# Patient Record
Sex: Male | Born: 1995 | Race: Black or African American | Hispanic: No | Marital: Single
Health system: Southern US, Community
[De-identification: ages and names within clinical notes are randomized; demographics above are authoritative.]

---
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
## 2019-12-13 ENCOUNTER — Encounter (HOSPITAL_COMMUNITY): Payer: Self-pay | Admitting: Emergency Medicine

## 2019-12-13 ENCOUNTER — Emergency Department (HOSPITAL_COMMUNITY): Payer: Self-pay

## 2019-12-13 ENCOUNTER — Emergency Department (HOSPITAL_COMMUNITY)
Admission: EM | Admit: 2019-12-13 | Discharge: 2019-12-13 | Disposition: A | Discharge status: Home / Self Care | Payer: Self-pay | Attending: Emergency Medicine | Admitting: Emergency Medicine

## 2019-12-13 DIAGNOSIS — G4452 New daily persistent headache (NDPH): Secondary | ICD-10-CM | POA: Insufficient documentation

## 2019-12-13 NOTE — ED Notes (Signed)
Patient verbalizes understanding of discharge instructions. Opportunity for questioning and answers were provided. Armband removed by staff. Patient discharged from ED.  

## 2019-12-13 NOTE — Discharge Instructions (Addendum)
Today your CT scan on your head was reassuring without mass or other cause for your symptoms found.  Please stop taking any ibuprofen or tylenol or other medications for your headache as this can worsen headaches.  Please start recording your headache symptoms.    If you develop any vision changes, fevers, weakness, numbness, your headache changes or you have nausea, vomiting, or other concerns please seek additional medical care and evaluation.

## 2019-12-13 NOTE — ED Triage Notes (Signed)
Pt reports L sided frontal headache for the past few weeks without associated side effects. Taking tylenol without relief. A/ox4, speech clear, no neuro deficits.

## 2019-12-13 NOTE — ED Provider Notes (Signed)
[facility] EMERGENCY DEPARTMENT Provider Note   CSN: [id number] Arrival date & time: 12/13/19  9476     History Chief Complaint  Patient presents with  . Headache    Randy Beard is a 24 y.o. male with no pertinent past medical history who presents today for evaluation of headaches.  He reports that over the past 2 to 3 weeks he has had a headache every single day.  These are on his temples, most often is on the left side however can occasionally be on the right side.  He has been taking Tylenol every day and reports that it will take the edge off his headache.  He states that his headache is keeping him from sleeping at night.  He denies any fevers.  No chiropractic adjustments or neck pain.  He denies any vision changes, weakness, numbness or tingling.  He does not get headaches frequently.  No significant changes in caffeine intake, diet, or life style.  He does note that he ran out of his Adderall prescription a few months ago and since then has had increased difficulty concentrating at work.  He does not have a primary care doctor.    HPI     History reviewed. No pertinent past medical history.  There are no problems to display for this patient.   Past Surgical History:  Procedure Laterality Date  . LAPAROSCOPIC APPENDECTOMY  01/01/2012   Procedure: APPENDECTOMY LAPAROSCOPIC;  Surgeon: Randy Petit. Leonia Corona, MD;  Location: [facility] OR;  Service: Pediatrics;  Laterality: N/A;       No family history on file.  Social History   Tobacco Use  . Smoking status: Not on file  Substance Use Topics  . Alcohol use: No  . Drug use: No    Home Medications Prior to Admission medications   Not on File    Allergies    Cephalosporins  Review of Systems   Review of Systems  Constitutional: Negative for chills and fever.  HENT: Negative for congestion.   Eyes: Negative for pain and visual disturbance.  Respiratory: Negative for chest tightness and shortness of  breath.   Cardiovascular: Negative for chest pain.  Gastrointestinal: Negative for abdominal pain, diarrhea, nausea and vomiting.  Genitourinary: Negative for dysuria.  Musculoskeletal: Negative for back pain and neck pain.  Skin: Negative for color change, rash and wound.  Neurological: Positive for headaches. Negative for dizziness, seizures, syncope, speech difficulty, weakness, light-headedness and numbness.  All other systems reviewed and are negative.   Physical Exam Updated Vital Signs BP 113/71   Pulse 58   Temp 98 F (36.7 C) (Oral)   Resp 16   Ht 5\' 7"  (1.702 m)   Wt 77.1 kg   SpO2 99%   BMI 26.63 kg/m   Physical Exam Vitals and nursing note reviewed.  Constitutional:      General: He is not in acute distress.    Appearance: He is well-developed. He is not diaphoretic.  HENT:     Head: Normocephalic and atraumatic.     Right Ear: Tympanic membrane, ear canal and external ear normal.     Left Ear: Tympanic membrane, ear canal and external ear normal.     Mouth/Throat:     Mouth: Mucous membranes are moist.  Eyes:     General: No scleral icterus.       Right eye: No discharge.        Left eye: No discharge.     Extraocular Movements:  Extraocular movements intact.     Right eye: No nystagmus.     Left eye: No nystagmus.     Conjunctiva/sclera: Conjunctivae normal.     Pupils: Pupils are equal, round, and reactive to light.  Cardiovascular:     Rate and Rhythm: Normal rate and regular rhythm.     Heart sounds: Normal heart sounds. No murmur heard.   Pulmonary:     Effort: Pulmonary effort is normal. No respiratory distress.     Breath sounds: No stridor.  Abdominal:     General: There is no distension.  Musculoskeletal:        General: No deformity.     Cervical back: Normal range of motion and neck supple.  Skin:    General: Skin is warm and dry.  Neurological:     Mental Status: He is alert and oriented to person, place, and time.     Motor: No  abnormal muscle tone.     Comments: Mental Status:  Alert, oriented, thought content appropriate, able to give a coherent history. Speech fluent without evidence of aphasia. Able to follow 2 step commands without difficulty.  Cranial Nerves:  II:  Peripheral visual fields grossly normal, pupils equal, round, reactive to light III,IV, VI: ptosis not present, extra-ocular motions intact bilaterally  V,VII: smile symmetric, facial light touch sensation equal VIII: hearing grossly normal to voice  X: uvula elevates symmetrically  XI: bilateral shoulder shrug symmetric and strong XII: midline tongue extension without fassiculations Motor:  Normal tone. 5/5 in upper and lower extremities bilaterally including strong and equal grip strength and dorsiflexion/plantar flexion Cerebellar: normal finger-to-nose with bilateral upper extremities Gait: normal gait and balance CV: distal pulses palpable throughout   Psychiatric:        Behavior: Behavior normal.     ED Results / Procedures / Treatments   Labs (all labs ordered are listed, but only abnormal results are displayed) Labs Reviewed - No data to display  EKG None  Radiology CT Head Wo Contrast  Result Date: 12/13/2019 CLINICAL DATA:  Headache. EXAM: CT HEAD WITHOUT CONTRAST TECHNIQUE: Contiguous axial images were obtained from the base of the skull through the vertex without intravenous contrast. COMPARISON:  None. FINDINGS: Brain: No evidence of acute infarction, hemorrhage, hydrocephalus, extra-axial collection or mass lesion/mass effect. Vascular: No hyperdense vessel or unexpected calcification. Skull: Normal. Negative for fracture or focal lesion. Sinuses/Orbits: No acute finding. Other: None. IMPRESSION: No acute intracranial pathology. Electronically Signed   By: Randy Beard M.D.   On: 12/13/2019 17:18    Procedures Procedures (including critical care time)  Medications Ordered in ED Medications - No data to  display  ED Course  I have reviewed the triage vital signs and the nursing notes.  Pertinent labs & imaging results that were available during my care of the patient were reviewed by me and considered in my medical decision making (see chart for details).    MDM Rules/Calculators/A&P                         Patient is a 24 year old man who presents today for evaluation of a new persistent daily headache over the past few weeks.  He is neurovascularly intact on my exam.  He does not frequently get headaches, and as this is a new, persistent and daily event CT head was ordered.  No stroke, mass, intracranial hemorrhage or other cause for his headache is found.  We discussed analgesic rebound headaches  in addition to increasing p.o. hydration.  He is instructed on keeping a headache diary.  Basic life style modifications such as reducing stress was discussed.  PCP follow up.  He is afebrile, not tachycardic or tachypneic, do not suspect infectious cause of his symptoms based on duration, reassuring vitals and neuro intact.  Return precautions were discussed with patient who states their understanding.  At the time of discharge patient denied any unaddressed complaints or concerns.  Patient is agreeable for discharge home.  Note: Portions of this report may have been transcribed using voice recognition software. Every effort was made to ensure accuracy; however, inadvertent computerized transcription errors may be present   Final Clinical Impression(s) / ED Diagnoses Final diagnoses:  New daily persistent headache    Rx / DC Orders ED Discharge Orders    None       Norman Clay 12/13/19 2359    Linwood Dibbles, MD 12/14/19 [phone]

## 2020-05-28 ENCOUNTER — Other Ambulatory Visit: Payer: 59

## 2020-05-28 DIAGNOSIS — Z20822 Contact with and (suspected) exposure to covid-19: Secondary | ICD-10-CM

## 2020-05-29 LAB — NOVEL CORONAVIRUS, NAA: SARS-CoV-2, NAA: NOT DETECTED

## 2020-05-29 LAB — SARS-COV-2, NAA 2 DAY TAT

## 2021-11-14 IMAGING — CT CT HEAD W/O CM
4 series · 17 of 47 positions shown, 19 images · non-contrast
Comparison: None.

CLINICAL DATA: Headache.

EXAM:
CT HEAD WITHOUT CONTRAST
TECHNIQUE: Contiguous axial images were obtained from the base of the skull
through the vertex without intravenous contrast.

[Series 3: head without · axial · non-contrast · 0.45mm/px · z∈[+184,+304]mm · 7 of 33 slices shown, 9 images]
[im 5/33  brain]
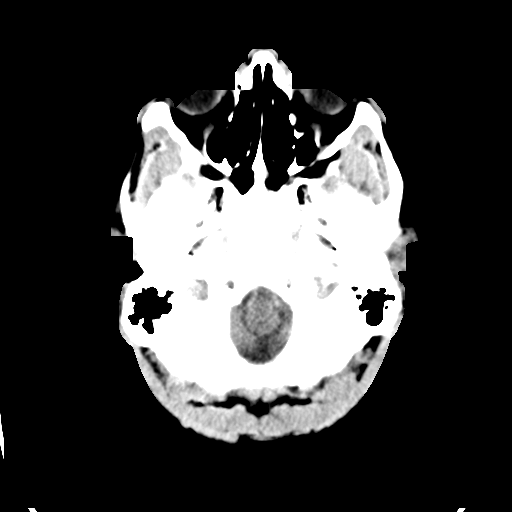
[im 5/33  bone]
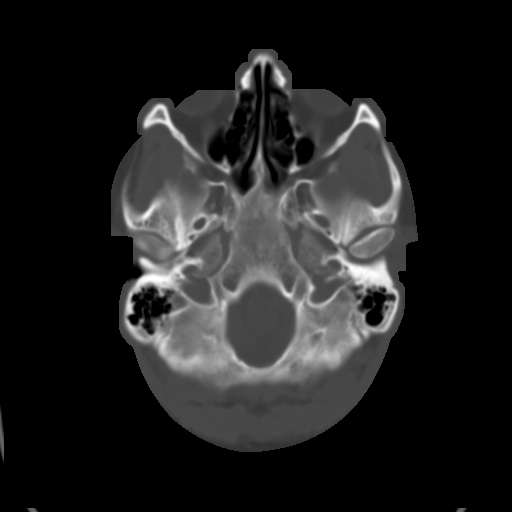
[im 9/33  brain]
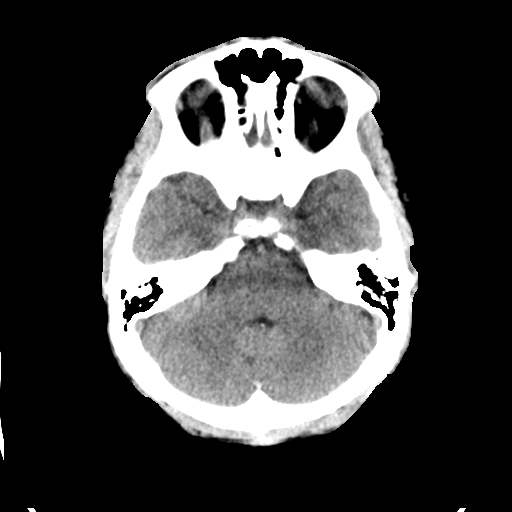
[im 13/33  brain]
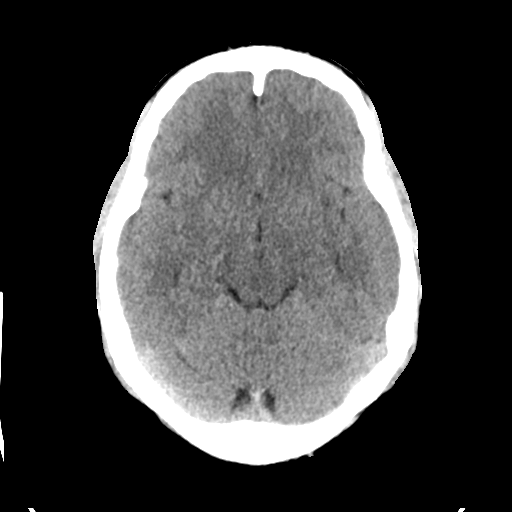
[im 17/33  brain]
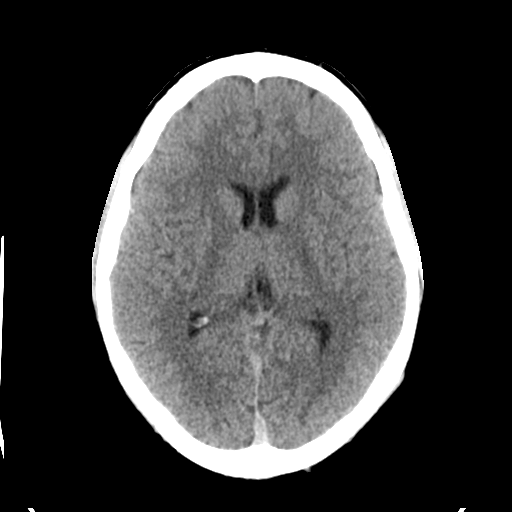
[im 21/33  brain]
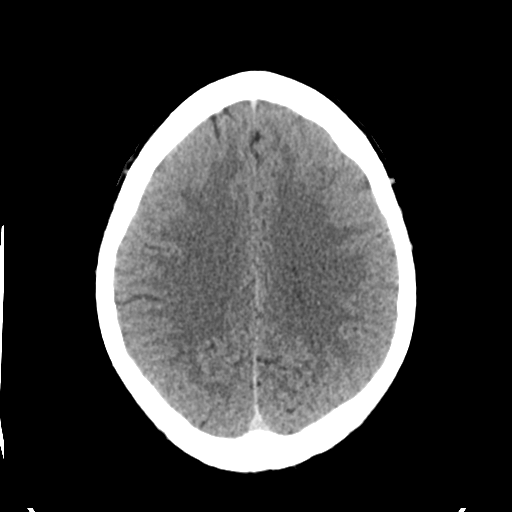
[im 21/33  bone]
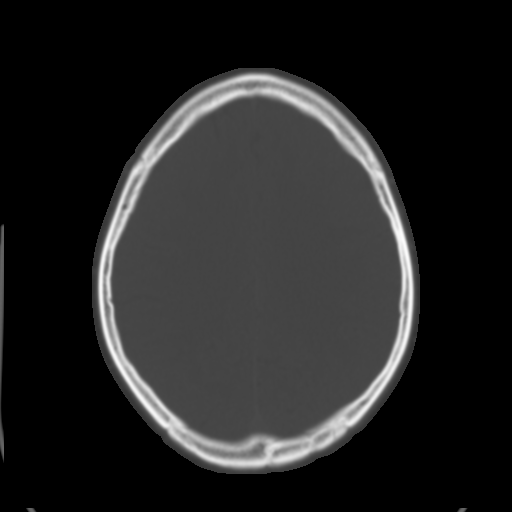
[im 25/33  brain]
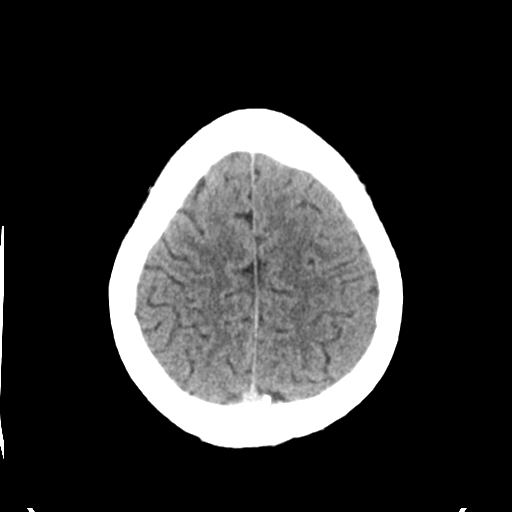
[im 29/33  brain]
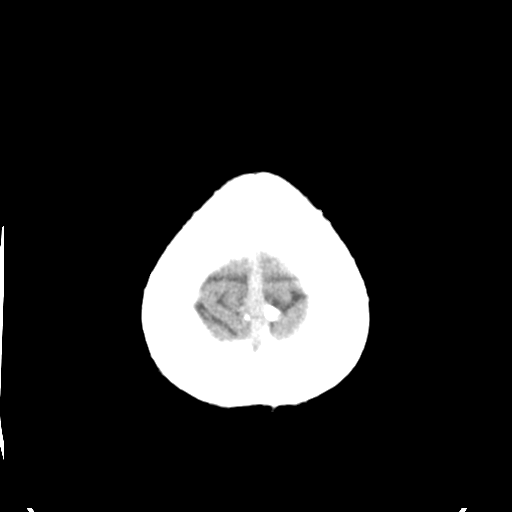

[Series 4: head bone · axial · 0.45mm/px · z∈[+180,+236]mm · 4 of 82 slices shown]
[im 9/82  bone]
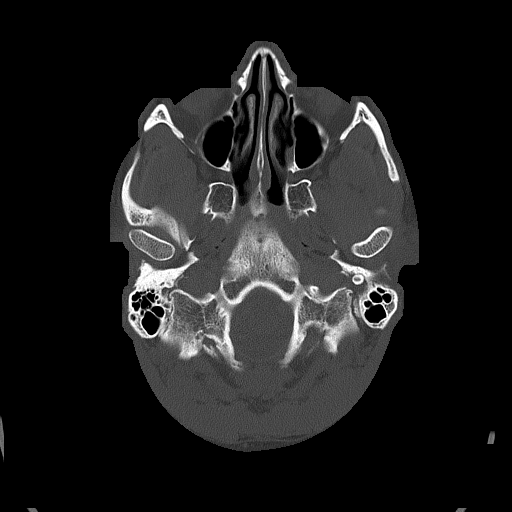
[im 17/82  bone]
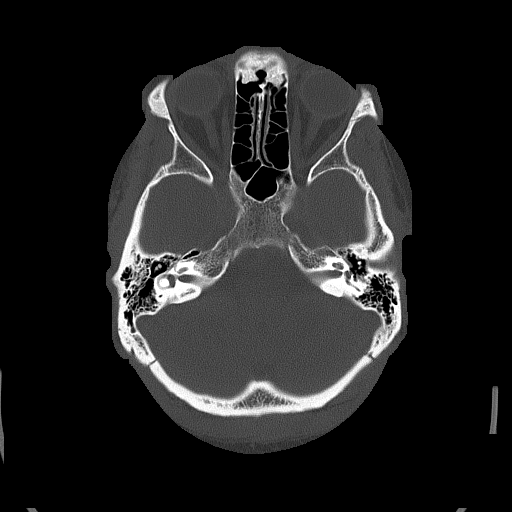
[im 25/82  bone]
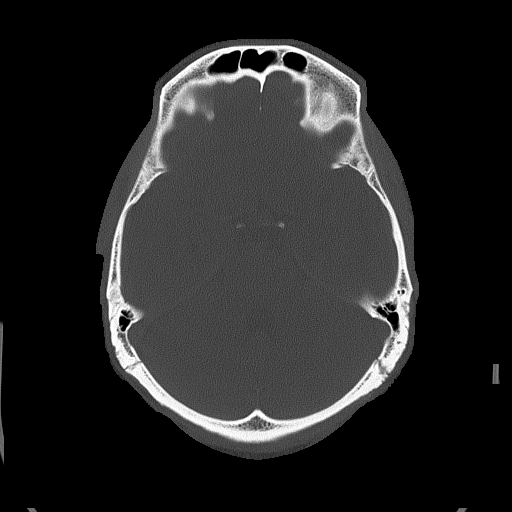
[im 37/82  bone]
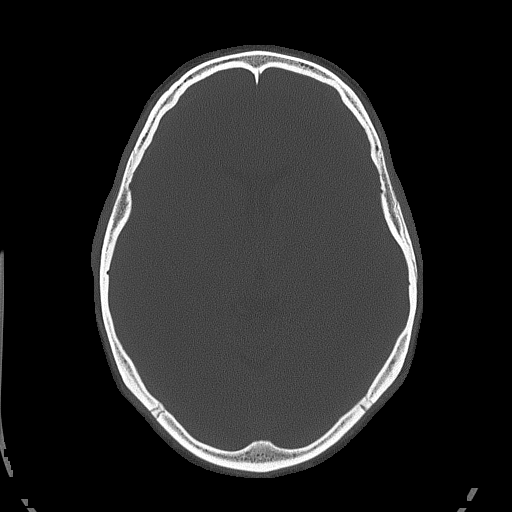

[Series 5: head without cor · coronal · non-contrast · 0.36mm/px · 3 of 73 slices shown]
[im 25/73  brain]
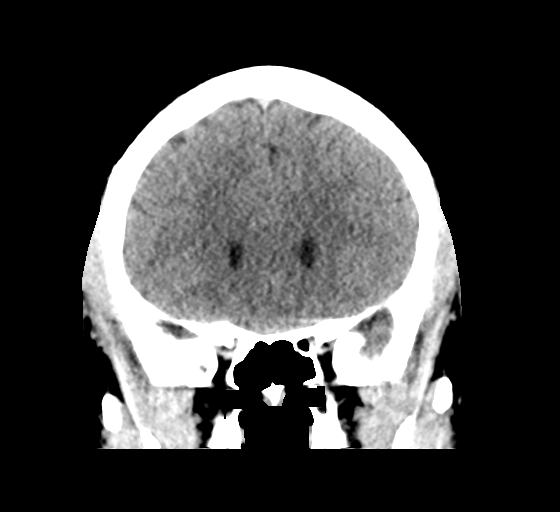
[im 33/73  brain]
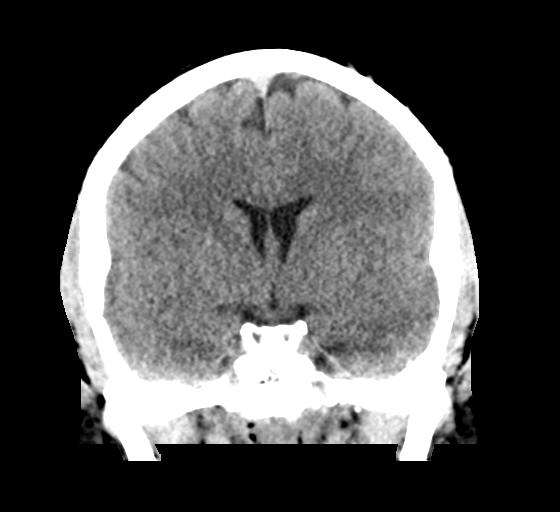
[im 41/73  brain]
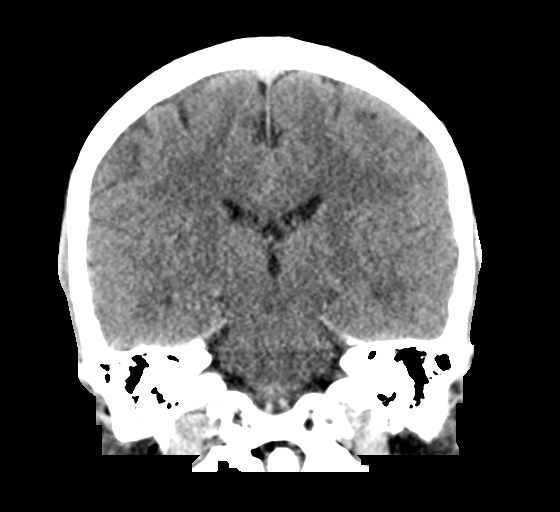

[Series 6: head without sag · sagittal · non-contrast · 0.35mm/px · 3 of 67 slices shown]
[im 23/67  brain]
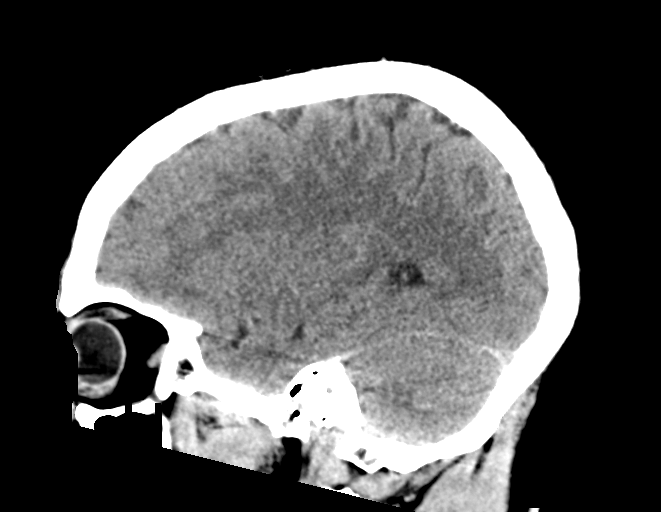
[im 34/67  brain]
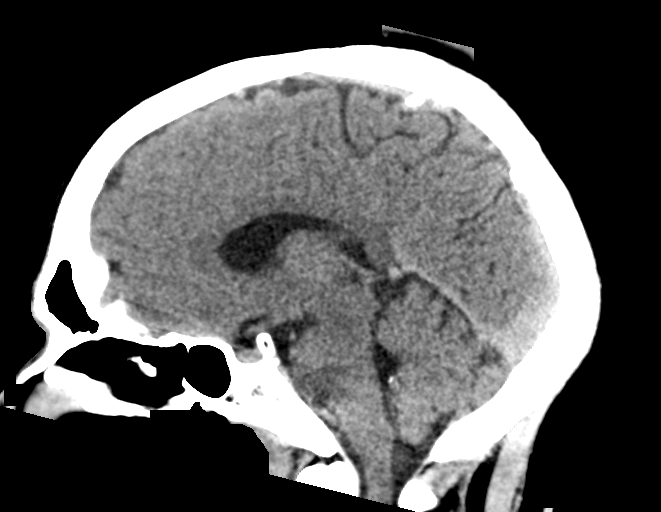
[im 45/67  brain]
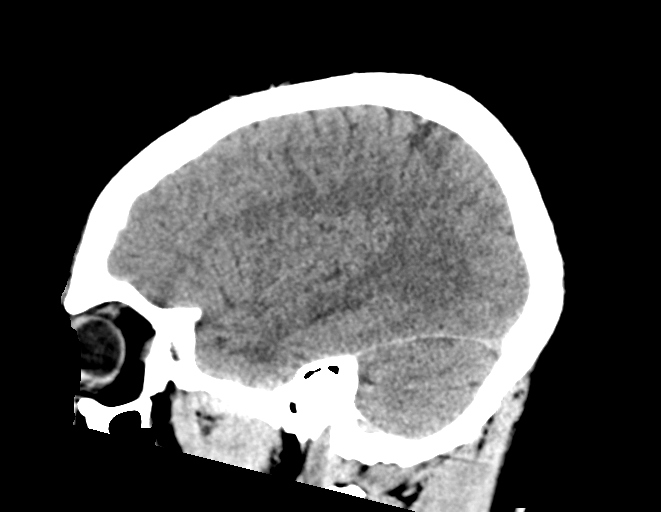

[17 of 47 positions shown; findings below may reference images not displayed]

FINDINGS: Brain: No evidence of acute infarction, hemorrhage, hydrocephalus,
extra-axial collection or mass lesion/mass effect.

Vascular: No hyperdense vessel or unexpected calcification.

Skull: Normal. Negative for fracture or focal lesion.

Sinuses/Orbits: No acute finding.

Other: None.
IMPRESSION: No acute intracranial pathology.
